# Patient Record
Sex: Female | Born: 1995 | Race: Black or African American | Hispanic: No | Marital: Single | Smoking: Never smoker
Health system: Southern US, Community
[De-identification: ages and names within clinical notes are randomized; demographics above are authoritative.]

## PROBLEM LIST (undated history)

## (undated) DIAGNOSIS — J45909 Unspecified asthma, uncomplicated: Secondary | ICD-10-CM

## (undated) DIAGNOSIS — G43909 Migraine, unspecified, not intractable, without status migrainosus: Secondary | ICD-10-CM

## (undated) HISTORY — PX: NO PAST SURGERIES: SHX2092

---

## 2020-01-07 ENCOUNTER — Emergency Department (HOSPITAL_COMMUNITY)
Admission: EM | Admit: 2020-01-07 | Discharge: 2020-01-07 | Disposition: A | Discharge status: Home / Self Care | Payer: Self-pay | Attending: Emergency Medicine | Admitting: Emergency Medicine

## 2020-01-07 ENCOUNTER — Other Ambulatory Visit: Payer: Self-pay

## 2020-01-07 ENCOUNTER — Emergency Department (HOSPITAL_COMMUNITY): Payer: Self-pay

## 2020-01-07 DIAGNOSIS — R0789 Other chest pain: Secondary | ICD-10-CM | POA: Insufficient documentation

## 2020-01-07 DIAGNOSIS — R0782 Intercostal pain: Secondary | ICD-10-CM | POA: Insufficient documentation

## 2020-01-07 DIAGNOSIS — R05 Cough: Secondary | ICD-10-CM | POA: Insufficient documentation

## 2020-01-07 DIAGNOSIS — R0602 Shortness of breath: Secondary | ICD-10-CM | POA: Insufficient documentation

## 2020-01-07 DIAGNOSIS — J4520 Mild intermittent asthma, uncomplicated: Secondary | ICD-10-CM | POA: Insufficient documentation

## 2020-01-07 LAB — CBC
HCT: 36.5 % (ref 36.0–46.0)
Hemoglobin: 12.1 g/dL (ref 12.0–15.0)
MCH: 31.9 pg (ref 26.0–34.0)
MCHC: 33.2 g/dL (ref 30.0–36.0)
MCV: 96.3 fL (ref 80.0–100.0)
Platelets: 351 10*3/uL (ref 150–400)
RBC: 3.79 MIL/uL — ABNORMAL LOW (ref 3.87–5.11)
RDW: 12.5 % (ref 11.5–15.5)
WBC: 9.4 10*3/uL (ref 4.0–10.5)
nRBC: 0 % (ref 0.0–0.2)

## 2020-01-07 LAB — BASIC METABOLIC PANEL
Anion gap: 11 (ref 5–15)
BUN: 16 mg/dL (ref 6–20)
CO2: 23 mmol/L (ref 22–32)
Calcium: 9.5 mg/dL (ref 8.9–10.3)
Chloride: 105 mmol/L (ref 98–111)
Creatinine, Ser: 1.04 mg/dL — ABNORMAL HIGH (ref 0.44–1.00)
GFR calc Af Amer: >60 mL/min (ref >60)
GFR calc non Af Amer: >60 mL/min (ref >60)
Glucose, Bld: 89 mg/dL (ref 70–99)
Potassium: 3.6 mmol/L (ref 3.5–5.1)
Sodium: 139 mmol/L (ref 135–145)

## 2020-01-07 LAB — TROPONIN I (HIGH SENSITIVITY): Troponin I (High Sensitivity): <2 ng/L (ref <18)

## 2020-01-07 MED ORDER — ALBUTEROL SULFATE HFA 108 (90 BASE) MCG/ACT IN AERS
2.0000 | INHALATION_SPRAY | RESPIRATORY_TRACT | Status: DC | PRN
  Administered 2020-01-07: 08:00:00 2 via RESPIRATORY_TRACT
  Filled 2020-01-07: qty 6.7

## 2020-01-07 MED ORDER — KETOROLAC TROMETHAMINE 15 MG/ML IJ SOLN
15.0000 mg | Freq: Once | INTRAMUSCULAR | Status: AC
  Administered 2020-01-07: 15 mg via INTRAVENOUS
  Filled 2020-01-07: qty 1

## 2020-01-07 MED ORDER — NAPROXEN 500 MG PO TABS: 500.0000 mg | ORAL_TABLET | Freq: Two times a day (BID) | ORAL | 0 refills | Status: DC

## 2020-01-07 MED ORDER — SODIUM CHLORIDE 0.9% FLUSH
3.0000 mL | Freq: Once | INTRAVENOUS | Status: AC
  Administered 2020-01-07: 3 mL via INTRAVENOUS

## 2020-01-07 MED ORDER — CYCLOBENZAPRINE HCL 10 MG PO TABS: 10.0000 mg | ORAL_TABLET | Freq: Every day | ORAL | 0 refills | Status: DC

## 2020-01-07 NOTE — ED Triage Notes (Addendum)
Pt in with L side cp/cramping that radiates down her whole L side x 4 days. Endorses cough and nausea that started today. C/o Sob when pain increases

## 2020-01-07 NOTE — ED Provider Notes (Signed)
[facility] EMERGENCY DEPARTMENT Provider Note   CSN: [id number] Arrival date & time: 01/07/20  0932     History Chief Complaint  Patient presents with  . Chest Pain  . rib pain    Megan Moran is a 24 y.o. female.  Patient is a 24 year old female with a history of asthma who is presenting today with 4 days of worsening left-sided chest pain.  She describes it as a sharp pain that starts in her mid left chest and goes all the way around her rib cage into her back.  She also occasionally feels it in her arm.  She does work for [employer] and does a lot of lifting and thought she had just pulled a muscle.  However it is worsened over the last 4 days and she got concerned.  She has had intermittent wheezing and coughing.  Patient has no unilateral leg pain or swelling, no recent immobilization or long travel and does not use OCPs.  She stopped smoking in December.  She is adopted so unknown family history.  She tested for Covid at the beginning of the year and it was negative.  However for about a month now she is felt like she has not had a normal sense of smell.  The history is provided by the patient.       No past medical history on file.  There are no problems to display for this patient.      OB History   No obstetric history on file.     No family history on file.  Social History   Tobacco Use  . Smoking status: Not on file  Substance Use Topics  . Alcohol use: Not on file  . Drug use: Not on file    Home Medications Prior to Admission medications   Not on File    Allergies    Patient has no allergy information on record.  Review of Systems   Review of Systems  All other systems reviewed and are negative.   Physical Exam Updated Vital Signs Wt 59 kg   Physical Exam Vitals and nursing note reviewed.  Constitutional:      General: She is not in acute distress.    Appearance: She is well-developed and normal weight.  HENT:   Head: Normocephalic and atraumatic.     Mouth/Throat:     Mouth: Mucous membranes are moist.  Eyes:     Conjunctiva/sclera: Conjunctivae normal.     Pupils: Pupils are equal, round, and reactive to light.  Cardiovascular:     Rate and Rhythm: Normal rate and regular rhythm.     Pulses: Normal pulses.     Heart sounds: No murmur.  Pulmonary:     Effort: Pulmonary effort is normal. No respiratory distress.     Breath sounds: Normal breath sounds. No wheezing or rales.    Chest:     Chest wall: Tenderness present.    Abdominal:     General: There is no distension.     Palpations: Abdomen is soft.     Tenderness: There is no abdominal tenderness. There is no guarding or rebound.  Musculoskeletal:        General: No tenderness. Normal range of motion.     Cervical back: Normal range of motion and neck supple.     Right lower leg: No edema.     Left lower leg: No edema.  Skin:    General: Skin is warm and dry.  Findings: No erythema or rash.  Neurological:     General: No focal deficit present.     Mental Status: She is alert and oriented to person, place, and time. Mental status is at baseline.  Psychiatric:        Mood and Affect: Mood normal.        Behavior: Behavior normal.        Thought Content: Thought content normal.     ED Results / Procedures / Treatments   Labs (all labs ordered are listed, but only abnormal results are displayed) Labs Reviewed  BASIC METABOLIC PANEL - Abnormal; Notable for the following components:      Result Value   Creatinine, Ser 1.04 (*)    All other components within normal limits  CBC - Abnormal; Notable for the following components:   RBC 3.79 (*)    All other components within normal limits  I-STAT BETA HCG BLOOD, ED ([facility], [facility], [facility] ONLY)  TROPONIN I (HIGH SENSITIVITY)  TROPONIN I (HIGH SENSITIVITY)    EKG EKG Interpretation  Date/Time:  Thursday January 07 2020 06:36:06 EST Ventricular Rate:  52 PR Interval:    QRS  Duration: 84 QT Interval:  444 QTC Calculation: 413 R Axis:   72 Text Interpretation: Sinus rhythm No old tracing to compare Confirmed by Rochele Raring [phone]) on 01/07/2020 6:39:07 AM   Radiology DG Chest 2 View  Result Date: 01/07/2020 CLINICAL DATA:  Chest pain. EXAM: CHEST - 2 VIEW COMPARISON:  None. FINDINGS: The heart size and mediastinal contours are within normal limits. Both lungs are clear. No pneumothorax or pleural effusion is noted. The visualized skeletal structures are unremarkable. IMPRESSION: No active cardiopulmonary disease. Electronically Signed   By: Lupita Raider M.D.   On: 01/07/2020 07:32    Procedures Procedures (including critical care time)  Medications Ordered in ED Medications  sodium chloride flush (NS) 0.9 % injection 3 mL (has no administration in time range)  ketorolac (TORADOL) 15 MG/ML injection 15 mg (has no administration in time range)  albuterol (VENTOLIN HFA) 108 (90 Base) MCG/ACT inhaler 2 puff (has no administration in time range)    ED Course  I have reviewed the triage vital signs and the nursing notes.  Pertinent labs & imaging results that were available during my care of the patient were reviewed by me and considered in my medical decision making (see chart for details).    MDM Rules/Calculators/A&P                      Patient is a 24 year old female with a history of asthma presenting with atypical chest pain.  Suspect musculoskeletal pain as she works in a very physical job doing a lot of lifting.  She is PERC negative and low suspicion for ACS.  Patient's EKG is within normal limits, chest x-ray within normal limits and labs without significant findings.  Patient is not wheezing on exam however she has had intermittent chest tightness and wheezing and has not been able to get her albuterol from her doctor.  She recently moved from [place] and has not established care here in [place] yet.  She did a test for Covid earlier this  year about a week ago but it came back negative.  She has not had any fever but states for about a month she feels like she is not had her smell.  Vital signs are within normal limits, pain is produced with palpation.  Low suspicion  for PE, dissection or GI pathology.  Pt improved after albuterol and toradol.  Labs and imaging neg.  Will d/c home with supportive care and pt given return precautions. Final Clinical Impression(s) / ED Diagnoses Final diagnoses:  Chest wall pain  Mild intermittent asthma, unspecified whether complicated    Rx / DC Orders ED Discharge Orders         Ordered    cyclobenzaprine (FLEXERIL) 10 MG tablet  Daily at bedtime     01/07/20 0844    naproxen (NAPROSYN) 500 MG tablet  2 times daily     01/07/20 6222           Blanchie Dessert, MD 01/07/20 1003

## 2020-01-07 NOTE — Discharge Instructions (Addendum)
Today your x-ray and blood work were all normal.  Use the anti-inflammatory medication for the next week and try to avoid heavy lifting with the left arm.  Use the inhaler as needed if you feel tight or wheezy.  Please return if you develop fever, inability to catch her breath or worsening symptoms.

## 2020-10-19 ENCOUNTER — Encounter: Payer: Self-pay | Admitting: Emergency Medicine

## 2020-10-19 ENCOUNTER — Ambulatory Visit
Admission: EM | Admit: 2020-10-19 | Discharge: 2020-10-19 | Disposition: A | Discharge status: Home / Self Care | Payer: Self-pay | Attending: Emergency Medicine | Admitting: Emergency Medicine

## 2020-10-19 DIAGNOSIS — Z7251 High risk heterosexual behavior: Secondary | ICD-10-CM | POA: Insufficient documentation

## 2020-10-19 DIAGNOSIS — R3915 Urgency of urination: Secondary | ICD-10-CM | POA: Insufficient documentation

## 2020-10-19 LAB — POCT URINALYSIS DIP (MANUAL ENTRY)
Bilirubin, UA: NEGATIVE
Glucose, UA: NEGATIVE mg/dL
Ketones, POC UA: NEGATIVE mg/dL
Nitrite, UA: NEGATIVE
Protein Ur, POC: NEGATIVE mg/dL
Spec Grav, UA: >=1.03 — AB (ref 1.010–1.025)
Urobilinogen, UA: 0.2 E.U./dL
pH, UA: 6 (ref 5.0–8.0)

## 2020-10-19 LAB — POCT URINE PREGNANCY: Preg Test, Ur: NEGATIVE

## 2020-10-19 MED ORDER — CEPHALEXIN 500 MG PO CAPS: 500.0000 mg | ORAL_CAPSULE | Freq: Two times a day (BID) | ORAL | 0 refills | Status: AC

## 2020-10-19 MED ORDER — MICONAZOLE NITRATE 2 % EX POWD: CUTANEOUS | 0 refills | Status: DC | PRN

## 2020-10-19 NOTE — Discharge Instructions (Addendum)
Today you received treatment for UTI. Testing for chlamydia, gonorrhea, trichomonas is pending: please look for these results on the MyChart app/website.  We will notify you if you are positive and outline treatment at that time.  Important to avoid all forms of sexual intercourse (oral, vaginal, anal) with any/all partners for the next 7 days to avoid spreading/reinfecting. Any/all sexual partners should be notified of testing/treatment today.  Return for persistent/worsening symptoms or if you develop fever, abdominal or pelvic pain, discharge, genital pain, blood in your urine, or are re-exposed to an STI. 

## 2020-10-19 NOTE — ED Provider Notes (Signed)
[facility]    CSN: [id number] Arrival date & time: 10/19/20  1909      History   Chief Complaint Chief Complaint  Patient presents with  . Urinary Tract Infection    HPI Megan Moran is a 24 y.o. female  Presenting for possible UTI.  Endorsing weeklong course of dysuria, urgency.  No back pain, abdominal pain, vaginal discharge.  Currently sexual active with both men and women: 1 partner.  Had unprotected intercourse 1 week ago.  First time having penetrative vaginal intercourse in > 5 yrs. requesting STI testing.  Denies history of HIV, syphilis.  History reviewed. No pertinent past medical history.  There are no problems to display for this patient.   History reviewed. No pertinent surgical history.  OB History   No obstetric history on file.      Home Medications    Prior to Admission medications   Medication Sig Start Date End Date Taking? Authorizing Provider  cephALEXin (KEFLEX) 500 MG capsule Take 1 capsule (500 mg total) by mouth 2 (two) times daily for 3 days. 10/19/20 10/22/20  Hall-Potvin, [place], PA-C  cyclobenzaprine (FLEXERIL) 10 MG tablet Take 1 tablet (10 mg total) by mouth at bedtime. 01/07/20   Gwyneth Sprout, MD  miconazole (MICOTIN) 2 % powder Apply topically as needed for itching. 10/19/20   Hall-Potvin, [place], PA-C  naproxen (NAPROSYN) 500 MG tablet Take 1 tablet (500 mg total) by mouth 2 (two) times daily. 01/07/20   Gwyneth Sprout, MD    Family History History reviewed. No pertinent family history.  Social History Social History   Tobacco Use  . Smoking status: Not on file  Substance Use Topics  . Alcohol use: Not on file  . Drug use: Not on file     Allergies   Patient has no allergy information on record.   Review of Systems As per HPI   Physical Exam Triage Vital Signs ED Triage Vitals [10/19/20 1919]  Enc Vitals Group     BP 105/72     Pulse Rate 96     Resp 20     Temp 98.6 F (37 C)      Temp Source Oral     SpO2 97 %     Weight      Height      Head Circumference      Peak Flow      Pain Score      Pain Loc      Pain Edu?      Excl. in GC?    No data found.  Updated Vital Signs BP 105/72 (BP Location: Left Arm)   Pulse 96   Temp 98.6 F (37 C) (Oral)   Resp 20   SpO2 97%   Visual Acuity Right Eye Distance:   Left Eye Distance:   Bilateral Distance:    Right Eye Near:   Left Eye Near:    Bilateral Near:     Physical Exam Constitutional:      General: She is not in acute distress. HENT:     Head: Normocephalic and atraumatic.  Eyes:     General: No scleral icterus.    Pupils: Pupils are equal, round, and reactive to light.  Cardiovascular:     Rate and Rhythm: Normal rate.  Pulmonary:     Effort: Pulmonary effort is normal.  Abdominal:     General: Bowel sounds are normal.     Palpations: Abdomen is soft.  Tenderness: There is no abdominal tenderness. There is no right CVA tenderness, left CVA tenderness or guarding.  Genitourinary:    Comments: Patient declined, self-swab performed Skin:    Coloration: Skin is not jaundiced or pale.  Neurological:     Mental Status: She is alert and oriented to person, place, and time.      UC Treatments / Results  Labs (all labs ordered are listed, but only abnormal results are displayed) Labs Reviewed  POCT URINALYSIS DIP (MANUAL ENTRY) - Abnormal; Notable for the following components:      Result Value   Spec Grav, UA >=1.030 (*)    Blood, UA large (*)    Leukocytes, UA Small (1+) (*)    All other components within normal limits  URINE CULTURE  RPR  HIV ANTIBODY (ROUTINE TESTING W REFLEX)  POCT URINE PREGNANCY  CERVICOVAGINAL ANCILLARY ONLY    EKG   Radiology No results found.  Procedures Procedures (including critical care time)  Medications Ordered in UC Medications - No data to display  Initial Impression / Assessment and Plan / UC Course  I have reviewed the triage vital  signs and the nursing notes.  Pertinent labs & imaging results that were available during my care of the patient were reviewed by me and considered in my medical decision making (see chart for details).     Urine as above, culture pending.  Will start Keflex given symptoms.  Cytology pending: We will treat for STI if indicated.  Return precautions discussed, pt verbalized understanding and is agreeable to plan. Final Clinical Impressions(s) / UC Diagnoses   Final diagnoses:  Urgency of urination  Unprotected sex     Discharge Instructions     Today you received treatment for UTI. Testing for chlamydia, gonorrhea, trichomonas is pending: please look for these results on the MyChart app/website.  We will notify you if you are positive and outline treatment at that time.  Important to avoid all forms of sexual intercourse (oral, vaginal, anal) with any/all partners for the next 7 days to avoid spreading/reinfecting. Any/all sexual partners should be notified of testing/treatment today.  Return for persistent/worsening symptoms or if you develop fever, abdominal or pelvic pain, discharge, genital pain, blood in your urine, or are re-exposed to an STI.    ED Prescriptions    Medication Sig Dispense Auth. Provider   cephALEXin (KEFLEX) 500 MG capsule Take 1 capsule (500 mg total) by mouth 2 (two) times daily for 3 days. 6 capsule Hall-Potvin, [place], PA-C   miconazole (MICOTIN) 2 % powder Apply topically as needed for itching. 70 g Hall-Potvin, [place], PA-C     PDMP not reviewed this encounter.   Hall-Potvin, [place], [place] 10/20/20 [phone]

## 2020-10-21 LAB — CERVICOVAGINAL ANCILLARY ONLY
Bacterial Vaginitis (gardnerella): POSITIVE — AB
Candida Glabrata: NEGATIVE
Candida Vaginitis: NEGATIVE
Chlamydia: NEGATIVE
Comment: NEGATIVE
Comment: NEGATIVE
Comment: NEGATIVE
Comment: NEGATIVE
Comment: NEGATIVE
Comment: NORMAL
Neisseria Gonorrhea: POSITIVE — AB
Trichomonas: NEGATIVE

## 2020-10-21 LAB — URINE CULTURE: Culture: NO GROWTH

## 2020-10-22 LAB — HIV ANTIBODY (ROUTINE TESTING W REFLEX): HIV Screen 4th Generation wRfx: NONREACTIVE

## 2020-10-22 LAB — RPR: RPR Ser Ql: NONREACTIVE

## 2020-11-22 ENCOUNTER — Telehealth: Payer: Self-pay

## 2020-11-22 ENCOUNTER — Ambulatory Visit
Admission: EM | Admit: 2020-11-22 | Discharge: 2020-11-22 | Disposition: A | Discharge status: Home / Self Care | Payer: Self-pay | Attending: Emergency Medicine | Admitting: Emergency Medicine

## 2020-11-22 DIAGNOSIS — Z7251 High risk heterosexual behavior: Secondary | ICD-10-CM

## 2020-11-22 DIAGNOSIS — Z8619 Personal history of other infectious and parasitic diseases: Secondary | ICD-10-CM

## 2020-11-22 MED ORDER — DOXYCYCLINE HYCLATE 100 MG PO CAPS: 100.0000 mg | ORAL_CAPSULE | Freq: Two times a day (BID) | ORAL | 0 refills | Status: AC

## 2020-11-22 MED ORDER — DOXYCYCLINE HYCLATE 100 MG PO CAPS: 100.0000 mg | ORAL_CAPSULE | Freq: Two times a day (BID) | ORAL | 0 refills | Status: DC

## 2020-11-22 MED ORDER — CEFTRIAXONE SODIUM 500 MG IJ SOLR
500.0000 mg | Freq: Once | INTRAMUSCULAR | Status: AC
  Administered 2020-11-22: 500 mg via INTRAMUSCULAR

## 2020-11-22 NOTE — Discharge Instructions (Signed)
Today you received treatment for gonorrhea, chlamydia. °Testing for chlamydia, gonorrhea, trichomonas is pending: please look for these results on the MyChart app/website.  We will notify you if you are positive and outline treatment at that time. ° °Important to avoid all forms of sexual intercourse (oral, vaginal, anal) with any/all partners for the next 7 days to avoid spreading/reinfecting. °Any/all sexual partners should be notified of testing/treatment today. ° °Return for persistent/worsening symptoms or if you develop fever, abdominal or pelvic pain, discharge, genital pain, blood in your urine, or are re-exposed to an STI. °

## 2020-11-22 NOTE — ED Triage Notes (Signed)
Pt states received a call yesterday to get treatment for gonorrhea. Pt denies sx's.

## 2020-11-22 NOTE — ED Provider Notes (Signed)
[facility]    CSN: [id number] Arrival date & time: 11/22/20  1709      History   Chief Complaint Chief Complaint  Patient presents with  . SEXUALLY TRANSMITTED DISEASE    HPI Megan Moran is a 24 y.o. female  Presenting for treatment for gonorrhea.  States that she was seen end of October.  Per chart review, 10/19/2020.  Tested positive for gonorrhea and BV.  Denies signs or symptoms at this time.  States that she was just notified yesterday.  History reviewed. No pertinent past medical history.  There are no problems to display for this patient.   History reviewed. No pertinent surgical history.  OB History   No obstetric history on file.      Home Medications    Prior to Admission medications   Medication Sig Start Date End Date Taking? Authorizing Provider  doxycycline (VIBRAMYCIN) 100 MG capsule Take 1 capsule (100 mg total) by mouth 2 (two) times daily for 7 days. 11/22/20 11/29/20  Hall-Potvin, [place], PA-C    Family History History reviewed. No pertinent family history.  Social History Social History   Tobacco Use  . Smoking status: Never Smoker  . Smokeless tobacco: Never Used  Substance Use Topics  . Alcohol use: Not Currently  . Drug use: Yes    Types: Marijuana     Allergies   Patient has no known allergies.   Review of Systems Review of Systems  Constitutional: Negative for fatigue and fever.  Respiratory: Negative for cough and shortness of breath.   Cardiovascular: Negative for chest pain and palpitations.  Gastrointestinal: Negative for constipation and diarrhea.  Genitourinary: Negative for dysuria, flank pain, frequency, hematuria, pelvic pain, urgency, vaginal bleeding, vaginal discharge and vaginal pain.     Physical Exam Triage Vital Signs ED Triage Vitals  Enc Vitals Group     BP 11/22/20 1736 (!) 120/42     Pulse Rate 11/22/20 1736 64     Resp 11/22/20 1736 20     Temp 11/22/20 1736 97.9 F (36.6  C)     Temp Source 11/22/20 1736 Oral     SpO2 11/22/20 1736 96 %     Weight --      Height --      Head Circumference --      Peak Flow --      Pain Score 11/22/20 1748 0     Pain Loc --      Pain Edu? --      Excl. in GC? --    No data found.  Updated Vital Signs BP (!) 120/42 (BP Location: Left Arm)   Pulse 64   Temp 97.9 F (36.6 C) (Oral)   Resp 20   LMP 11/17/2020   SpO2 96%   Visual Acuity Right Eye Distance:   Left Eye Distance:   Bilateral Distance:    Right Eye Near:   Left Eye Near:    Bilateral Near:     Physical Exam Constitutional:      General: She is not in acute distress. HENT:     Head: Normocephalic and atraumatic.  Eyes:     General: No scleral icterus.    Pupils: Pupils are equal, round, and reactive to light.  Cardiovascular:     Rate and Rhythm: Normal rate.  Pulmonary:     Effort: Pulmonary effort is normal.  Abdominal:     General: Bowel sounds are normal.     Palpations: Abdomen is soft.  Tenderness: There is no abdominal tenderness. There is no right CVA tenderness, left CVA tenderness or guarding.  Genitourinary:    Comments: Patient declined, self-swab performed Skin:    Coloration: Skin is not jaundiced or pale.  Neurological:     Mental Status: She is alert and oriented to person, place, and time.      UC Treatments / Results  Labs (all labs ordered are listed, but only abnormal results are displayed) Labs Reviewed  CERVICOVAGINAL ANCILLARY ONLY    EKG   Radiology No results found.  Procedures Procedures (including critical care time)  Medications Ordered in UC Medications  cefTRIAXone (ROCEPHIN) injection 500 mg (500 mg Intramuscular Given 11/22/20 1819)    Initial Impression / Assessment and Plan / UC Course  I have reviewed the triage vital signs and the nursing notes.  Pertinent labs & imaging results that were available during my care of the patient were reviewed by me and considered in my  medical decision making (see chart for details).     Cytology pending.  Will cover empirically for G/C given unprotected intercourse with positive gonorrhea 1 month ago.  Return precautions discussed, pt verbalized understanding and is agreeable to plan. Final Clinical Impressions(s) / UC Diagnoses   Final diagnoses:  History of gonorrhea  Unprotected sex     Discharge Instructions     Today you received treatment for gonorrhea, chlamydia. Testing for chlamydia, gonorrhea, trichomonas is pending: please look for these results on the MyChart app/website.  We will notify you if you are positive and outline treatment at that time.  Important to avoid all forms of sexual intercourse (oral, vaginal, anal) with any/all partners for the next 7 days to avoid spreading/reinfecting. Any/all sexual partners should be notified of testing/treatment today.  Return for persistent/worsening symptoms or if you develop fever, abdominal or pelvic pain, discharge, genital pain, blood in your urine, or are re-exposed to an STI.    ED Prescriptions    Medication Sig Dispense Auth. Provider   doxycycline (VIBRAMYCIN) 100 MG capsule Take 1 capsule (100 mg total) by mouth 2 (two) times daily for 7 days. 14 capsule Hall-Potvin, [place], PA-C     PDMP not reviewed this encounter.   Odette Fraction [place], [place] 11/22/20 1857

## 2020-11-24 LAB — CERVICOVAGINAL ANCILLARY ONLY
Bacterial Vaginitis (gardnerella): POSITIVE — AB
Chlamydia: NEGATIVE
Comment: NEGATIVE
Comment: NEGATIVE
Comment: NEGATIVE
Comment: NORMAL
Neisseria Gonorrhea: POSITIVE — AB
Trichomonas: NEGATIVE

## 2020-11-24 MED ORDER — METRONIDAZOLE 500 MG PO TABS: 500.0000 mg | ORAL_TABLET | Freq: Two times a day (BID) | ORAL | 0 refills | Status: DC

## 2021-01-16 ENCOUNTER — Encounter: Payer: Self-pay | Admitting: Internal Medicine

## 2021-01-16 ENCOUNTER — Ambulatory Visit: Payer: BC Managed Care – PPO | Attending: Internal Medicine | Admitting: Internal Medicine

## 2021-01-16 ENCOUNTER — Other Ambulatory Visit: Payer: Self-pay

## 2021-01-16 VITALS — BP 111/64 | HR 58 | Temp 98.3°F | Resp 16 | Ht 65.0 in | Wt 143.8 lb

## 2021-01-16 DIAGNOSIS — Z6379 Other stressful life events affecting family and household: Secondary | ICD-10-CM | POA: Insufficient documentation

## 2021-01-16 DIAGNOSIS — Z7689 Persons encountering health services in other specified circumstances: Secondary | ICD-10-CM | POA: Diagnosis not present

## 2021-01-16 DIAGNOSIS — G43009 Migraine without aura, not intractable, without status migrainosus: Secondary | ICD-10-CM | POA: Insufficient documentation

## 2021-01-16 DIAGNOSIS — Z2821 Immunization not carried out because of patient refusal: Secondary | ICD-10-CM | POA: Insufficient documentation

## 2021-01-16 DIAGNOSIS — K648 Other hemorrhoids: Secondary | ICD-10-CM | POA: Insufficient documentation

## 2021-01-16 DIAGNOSIS — R079 Chest pain, unspecified: Secondary | ICD-10-CM | POA: Insufficient documentation

## 2021-01-16 MED ORDER — IBUPROFEN 800 MG PO TABS: 800.0000 mg | ORAL_TABLET | Freq: Three times a day (TID) | ORAL | 1 refills | Status: DC | PRN

## 2021-01-16 MED ORDER — TOPIRAMATE 25 MG PO TABS: 25.0000 mg | ORAL_TABLET | Freq: Every day | ORAL | 1 refills | Status: DC

## 2021-01-16 MED ORDER — HYDROCORTISONE ACETATE 25 MG RE SUPP: 25.0000 mg | Freq: Two times a day (BID) | RECTAL | 1 refills | Status: AC | PRN

## 2021-01-16 NOTE — Progress Notes (Signed)
Pt states she gets a sharp pain in left upper chest  Pt states she gets sharp pain in left side of head
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
## 2021-01-16 NOTE — Patient Instructions (Signed)

## 2021-04-11 ENCOUNTER — Ambulatory Visit: Payer: Self-pay | Admitting: *Deleted

## 2021-04-11 NOTE — Telephone Encounter (Signed)
Pt reports abdominal pain, pelvic region. Onset 2 days ago. Reports radiates throughout abdomen and chest. States "I can feel it in my anus also." H/O internal and external hemorrhoids. No bleeding. States rectal area "Very swollen." Reports constipation due to pain. Rates pain at 8/10, constant.Also reports nausea, no vomiting "I try not to do that."  Directed to ED. States will follow disposition.   Reason for Disposition . [1] SEVERE pain (e.g., excruciating) AND [2] present > 1 hour  Answer Assessment - Initial Assessment Questions 1. LOCATION: "Where does it hurt?"      Lower back and pelvic area 2. RADIATION: "Does the pain shoot anywhere else?" (e.g., chest, back)     Throughout stomach and chest 3. ONSET: "When did the pain begin?" (e.g., minutes, hours or days ago)      2-3 days 4. SUDDEN: "Gradual or sudden onset?"     Gradually 5. PATTERN "Does the pain come and go, or is it constant?"    - If constant: "Is it getting better, staying the same, or worsening?"      (Note: Constant means the pain never goes away completely; most serious pain is constant and it progresses)     - If intermittent: "How long does it last?" "Do you have pain now?"     (Note: Intermittent means the pain goes away completely between bouts)     Constant 6. SEVERITY: "How bad is the pain?"  (e.g., Scale 1-10; mild, moderate, or severe)   - MILD (1-3): doesn't interfere with normal activities, abdomen soft and not tender to touch    - MODERATE (4-7): interferes with normal activities or awakens from sleep, tender to touch    - SEVERE (8-10): excruciating pain, doubled over, unable to do any normal activities      8/10 7. RECURRENT SYMPTOM: "Have you ever had this type of stomach pain before?" If Yes, ask: "When was the last time?" and "What happened that time?"      No 8. CAUSE: "What do you think is causing the stomach pain?"     Unsure 9. RELIEVING/AGGRAVATING FACTORS: "What makes it better or worse?"  (e.g., movement, antacids, bowel movement)     Anti -nausea meds. No vomiting 10. OTHER SYMPTOMS: "Has there been any vomiting, diarrhea, constipation, or urine problems?"      Constipation, dysuria. External and internal hemorrhoids. 11. PREGNANCY: "Is there any chance you are pregnant?" "When was your last menstrual period?"       "There's a chance."  LMP 04/03/21  Protocols used: ABDOMINAL PAIN - [facility]

## 2021-04-12 NOTE — Telephone Encounter (Signed)
Will forward to provider  

## 2021-04-13 NOTE — Telephone Encounter (Signed)
Pt went to the Urgent Care yesterday

## 2021-04-29 ENCOUNTER — Other Ambulatory Visit: Payer: Self-pay | Admitting: Internal Medicine

## 2021-04-29 DIAGNOSIS — G43009 Migraine without aura, not intractable, without status migrainosus: Secondary | ICD-10-CM

## 2021-04-30 NOTE — Telephone Encounter (Signed)
Requested medication (s) are due for refill today: yes  Requested medication (s) are on the active medication list: yes  Last refill:  01/16/21  Future visit scheduled: no  Notes to clinic:  med not delegated to NT to RF/90 day refill requested   Requested Prescriptions  Pending Prescriptions Disp Refills   topiramate (TOPAMAX) 25 MG tablet [Pharmacy Med Name: TOPIRAMATE 25 MG TABLET] 90 tablet 1    Sig: TAKE 1 TABLET BY MOUTH EVERYDAY AT BEDTIME      Not Delegated - Neurology: Anticonvulsants - topiramate & zonisamide Failed - 04/29/2021  9:08 PM      Failed - This refill cannot be delegated      Failed - Cr in normal range and within 360 days    Creatinine, Ser  Date Value Ref Range Status  01/07/2020 1.04 (H) 0.44 - 1.00 mg/dL Final          Failed - CO2 in normal range and within 360 days    CO2  Date Value Ref Range Status  01/07/2020 23 22 - 32 mmol/L Final          Passed - Valid encounter within last 12 months    Recent Outpatient Visits           3 months ago Encounter to establish care   [facility] Marcine Matar, MD

## 2021-09-21 ENCOUNTER — Other Ambulatory Visit: Payer: Self-pay

## 2021-09-21 ENCOUNTER — Emergency Department (HOSPITAL_COMMUNITY): Payer: 59

## 2021-09-21 ENCOUNTER — Encounter (HOSPITAL_COMMUNITY): Payer: Self-pay | Admitting: Emergency Medicine

## 2021-09-21 ENCOUNTER — Emergency Department (HOSPITAL_COMMUNITY)
Admission: EM | Admit: 2021-09-21 | Discharge: 2021-09-22 | Disposition: A | Discharge status: Home / Self Care | Payer: 59 | Attending: Emergency Medicine | Admitting: Emergency Medicine

## 2021-09-21 DIAGNOSIS — J45909 Unspecified asthma, uncomplicated: Secondary | ICD-10-CM | POA: Diagnosis not present

## 2021-09-21 DIAGNOSIS — Z20822 Contact with and (suspected) exposure to covid-19: Secondary | ICD-10-CM | POA: Diagnosis not present

## 2021-09-21 DIAGNOSIS — Z5321 Procedure and treatment not carried out due to patient leaving prior to being seen by health care provider: Secondary | ICD-10-CM | POA: Diagnosis not present

## 2021-09-21 HISTORY — DX: Migraine, unspecified, not intractable, without status migrainosus: G43.909

## 2021-09-21 HISTORY — DX: Unspecified asthma, uncomplicated: J45.909

## 2021-09-21 LAB — RESP PANEL BY RT-PCR (FLU A&B, COVID) ARPGX2
Influenza A by PCR: NEGATIVE
Influenza B by PCR: NEGATIVE
SARS Coronavirus 2 by RT PCR: NEGATIVE

## 2021-09-21 MED ORDER — IPRATROPIUM-ALBUTEROL 0.5-2.5 (3) MG/3ML IN SOLN: 3.0000 mL | Freq: Once | RESPIRATORY_TRACT | Status: DC

## 2021-09-21 NOTE — ED Provider Notes (Signed)
Emergency Medicine Provider Triage Evaluation Note  [name] , a 25 y.o. female  was evaluated in triage.  Pt complains of chest pain and shortness of breath over the last few days.  Does have a history of asthma and states that she has not had any nebulizer treatments as [place].  She denies any fever, congestion, abdominal pain, nausea, vomiting, diarrhea.  Review of Systems  Positive: Chills, headache Negative:   Physical Exam  BP (!) 120/94 (BP Location: Left Arm)   Pulse 61   Temp 99.1 F (37.3 C) (Oral)   Resp 20   SpO2 100%  Gen:   Awake, no distress   Resp:  Normal effort, mild expiratory wheeze heard throughout MSK:   Moves extremities without difficulty  Other:    Medical Decision Making  Medically screening exam initiated at 7:16 PM.  Appropriate orders placed.  Megan Moran was informed that the remainder of the evaluation will be completed by another provider, this initial triage assessment does not replace that evaluation, and the importance of remaining in the ED until their evaluation is complete.     Teressa Lower, PA-C 09/21/21 1918    Benjiman Core, MD 09/21/21 2235

## 2021-09-21 NOTE — ED Triage Notes (Signed)
Pt reports being out of her inhaler prescription and her asthma has been acting up for a few weeks. SOB when walking.

## 2021-09-22 NOTE — ED Notes (Signed)
Pt reports she is leaving and will see her PCP provider, risks of leaving explained to the patient who verbalized understanding. Pt seen leaving the ER with independent steady gait

## 2021-09-22 NOTE — ED Notes (Signed)
Pt states she will try her primary care to get medicine. Advised patient to stay.

## 2021-11-28 ENCOUNTER — Ambulatory Visit: Payer: 59 | Attending: Nurse Practitioner | Admitting: Nurse Practitioner

## 2021-11-28 ENCOUNTER — Other Ambulatory Visit: Payer: Self-pay

## 2021-11-28 ENCOUNTER — Encounter: Payer: Self-pay | Admitting: Nurse Practitioner

## 2021-11-28 DIAGNOSIS — J4520 Mild intermittent asthma, uncomplicated: Secondary | ICD-10-CM

## 2021-11-28 NOTE — Progress Notes (Signed)
Virtual Visit via Telephone Note Due to national recommendations of social distancing due to COVID 19, telehealth visit is felt to be most appropriate for this patient at this time.  I discussed the limitations, risks, security and privacy concerns of performing an evaluation and management service by telephone and the availability of in person appointments. I also discussed with the patient that there may be a patient responsible charge related to this service. The patient expressed understanding and agreed to proceed.    I connected with [facility] on 11/28/21  at   1:30 PM EST  EDT by telephone and verified that I am speaking with the correct person using two identifiers.  Location of Patient: Private Residence   Location of Provider: [facility]    Persons participating in Telemedicine visit: Bertram Denver FNP-BC Arial    History of Present Illness: Telemedicine visit for: Asthma and Migraines  She is requesting a letter for her employer as she has been working upwards of 10-14 hours and this has been affecting her health in regard to her migraines and asthma.  States when she works  prolonged hours after her normal scheduled shift of 8p-430a her migraines increase in intensity and she has been having to use her inhaler where she works more often. She was told that she would not be allowed to take additional breaks beyond her company approved 30 minute break and two 15 minute breaks. She only requires excessive use of her inhaler when she works more than 8 hours at [employer].  She is not a smoker.     Past Medical History:  Diagnosis Date   Asthma    Migraine     Past Surgical History:  Procedure Laterality Date   NO PAST SURGERIES      Family History  Adopted: Yes  Problem Relation Age of Onset   Diabetes Mother    Hypertension Mother    Seizures Paternal Uncle     Social History   Socioeconomic History   Marital  status: Single    Spouse name: Not on file   Number of children: 0   Years of education: Not on file   Highest education level: 12th grade  Occupational History   Not on file  Tobacco Use   Smoking status: Never   Smokeless tobacco: Never  Substance and Sexual Activity   Alcohol use: Not Currently   Drug use: Yes    Types: Marijuana    Comment: 1-2 x a wk   Sexual activity: Yes    Birth control/protection: Condom  Other Topics Concern   Not on file  Social History Narrative   Not on file   Social Determinants of Health   Financial Resource Strain: Not on file  Food Insecurity: Not on file  Transportation Needs: Not on file  Physical Activity: Not on file  Stress: Not on file  Social Connections: Not on file     Observations/Objective: Awake, alert and oriented x 3   Review of Systems  Constitutional:  Negative for fever, malaise/fatigue and weight loss.  HENT: Negative.  Negative for nosebleeds.   Eyes: Negative.  Negative for blurred vision, double vision and photophobia.  Respiratory:  Positive for cough, shortness of breath and wheezing.   Cardiovascular: Negative.  Negative for chest pain, palpitations and leg swelling.  Gastrointestinal: Negative.  Negative for heartburn, nausea and vomiting.  Musculoskeletal: Negative.  Negative for myalgias.  Neurological:  Positive for headaches. Negative for dizziness,  focal weakness and seizures.  Psychiatric/Behavioral: Negative.  Negative for suicidal ideas.    Assessment and Plan: Diagnoses and all orders for this visit:  Mild intermittent asthma without complication  Work note given in Mychart  Follow Up Instructions Return if symptoms worsen or fail to improve.     I discussed the assessment and treatment plan with the patient. The patient was provided an opportunity to ask questions and all were answered. The patient agreed with the plan and demonstrated an understanding of the instructions.   The patient was  advised to call back or seek an in-person evaluation if the symptoms worsen or if the condition fails to improve as anticipated.  I provided 10 minutes of non-face-to-face time during this encounter including median intraservice time, reviewing previous notes, labs, imaging, medications and explaining diagnosis and management.  Claiborne Rigg, FNP-BC

## 2022-01-11 ENCOUNTER — Other Ambulatory Visit: Payer: Self-pay

## 2022-01-11 ENCOUNTER — Encounter: Payer: Self-pay | Admitting: Physician Assistant

## 2022-01-11 ENCOUNTER — Ambulatory Visit: Payer: BC Managed Care – PPO | Attending: Physician Assistant | Admitting: Physician Assistant

## 2022-01-11 VITALS — BP 103/69 | HR 68 | Resp 16 | Wt 141.6 lb

## 2022-01-11 DIAGNOSIS — Z09 Encounter for follow-up examination after completed treatment for conditions other than malignant neoplasm: Secondary | ICD-10-CM | POA: Diagnosis not present

## 2022-01-11 DIAGNOSIS — L02419 Cutaneous abscess of limb, unspecified: Secondary | ICD-10-CM | POA: Diagnosis not present

## 2022-01-11 DIAGNOSIS — M5441 Lumbago with sciatica, right side: Secondary | ICD-10-CM | POA: Diagnosis not present

## 2022-01-11 MED ORDER — MUPIROCIN 2 % EX OINT: 1.0000 "application " | TOPICAL_OINTMENT | Freq: Two times a day (BID) | CUTANEOUS | 0 refills | Status: AC

## 2022-01-11 MED ORDER — METHOCARBAMOL 500 MG PO TABS: 500.0000 mg | ORAL_TABLET | Freq: Three times a day (TID) | ORAL | 0 refills | Status: DC | PRN

## 2022-01-11 MED ORDER — DICLOFENAC SODIUM 75 MG PO TBEC: 75.0000 mg | DELAYED_RELEASE_TABLET | Freq: Two times a day (BID) | ORAL | 0 refills | Status: DC

## 2022-01-11 NOTE — Progress Notes (Signed)
Patient ID: Megan Moran, female   DOB: August 01, 1996, 26 y.o.   MRN: [id number]   Megan Moran, is a 26 y.o. female  [id number]  [id number]  DOB - 06-20-1996  Chief Complaint  Patient presents with   Back Pain    st   Abdominal Pain       Subjective:   Megan Moran is a 26 y.o. female here today for a follow up visit after being seen at [facility] 2 times for abscess in the L axilla.  She just finished the doxy and never had to take the cephalexin.  Still draining a little but much improved.  Also having some lower back pain that radiates into R leg.  NKI.  No urinary s/sx.  Not SA in about 10 months.  Also needing additional paperwork filled out for a note written in December regarding work hours    Patient has No headache, No chest pain, some abdominal cramping.  No N/V- No Nausea, No new weakness tingling or numbness, No Cough - SOB.  No problems updated.  ALLERGIES: No Known Allergies  PAST MEDICAL HISTORY: Past Medical History:  Diagnosis Date   Asthma    Migraine     MEDICATIONS AT HOME: Prior to Admission medications   Medication Sig Start Date End Date Taking? Authorizing Provider  diclofenac (VOLTAREN) 75 MG EC tablet Take 1 tablet (75 mg total) by mouth 2 (two) times daily. 01/11/22  Yes [place] Spainhower, Dionne Bucy, PA-C  methocarbamol (ROBAXIN) 500 MG tablet Take 1 tablet (500 mg total) by mouth every 8 (eight) hours as needed for muscle spasms. 01/11/22  Yes Argentina Donovan, PA-C  mupirocin ointment (BACTROBAN) 2 % Apply 1 application topically 2 (two) times daily. 01/11/22  Yes Tyyne Cliett M, PA-C  hydrocortisone (ANUSOL-HC) 25 MG suppository Place 1 suppository (25 mg total) rectally 2 (two) times daily as needed for hemorrhoids or anal itching. 01/16/21   Ladell Pier, MD  ibuprofen (ADVIL) 800 MG tablet Take 1 tablet (800 mg total) by mouth every 8 (eight) hours as needed. Take with food 01/16/21   Ladell Pier, MD  metroNIDAZOLE (FLAGYL) 500 MG  tablet Take 1 tablet (500 mg total) by mouth 2 (two) times daily. Patient not taking: Reported on 01/16/2021 11/24/20   Chase Picket, MD  topiramate (TOPAMAX) 25 MG tablet TAKE 1 TABLET BY MOUTH EVERYDAY AT BEDTIME 05/01/21   Ladell Pier, MD    ROS: Neg HEENT Neg resp Neg cardiac Neg psych Neg neuro  Objective:   Vitals:   01/11/22 1350  BP: 103/69  Pulse: 68  Resp: 16  SpO2: 100%  Weight: 141 lb 9.6 oz (64.2 kg)   Exam General appearance : Awake, alert, not in any distress. Speech Clear. Not toxic looking HEENT: Atraumatic and Normocephalic, pupils equally reactive to light and accomodation Neck: Supple, no JVD. No cervical lymphadenopathy.  Chest: Good air entry bilaterally, CTAB.  No rales/rhonchi/wheezing   CVS: S1 S2 regular, no murmurs.  L axilla-site for I&D healing with minimal drainage.  No erythema or induration Back-full S&ROM.  Some spasms in B lower back.  Neg SLR B.  B DTR=intact B. Extremities: B/L Lower Ext shows no edema, both legs are warm to touch Neurology: Awake alert, and oriented X 3, CN II-XII intact, Non focal Skin: No Rash  Data Review No results found for: HGBA1C  Assessment & Plan   1. Acute right-sided low back pain with right-sided sciatica No red flags - diclofenac (VOLTAREN)  75 MG EC tablet; Take 1 tablet (75 mg total) by mouth 2 (two) times daily.  Dispense: 30 tablet; Refill: 0 - methocarbamol (ROBAXIN) 500 MG tablet; Take 1 tablet (500 mg total) by mouth every 8 (eight) hours as needed for muscle spasms.  Dispense: 90 tablet; Refill: 0 - Comprehensive metabolic panel  2. Abscess, axilla resolving - mupirocin ointment (BACTROBAN) 2 %; Apply 1 application topically 2 (two) times daily.  Dispense: 22 g; Refill: 0 - CBC with Differential/Platelet  3. Encounter for examination following treatment at hospital Improving. I am unfamiliar with reasons discussed for accomodation at work.  We will have the patient leave the forms for  Zelda to complete(Zelda wrote note on 11/28/2021)    Patient have been counseled extensively about nutrition and exercise. Other issues discussed during this visit include: low cholesterol diet, weight control and daily exercise, foot care, annual eye examinations at Ophthalmology, importance of adherence with medications and regular follow-up. We also discussed long term complications of uncontrolled diabetes and hypertension.   Return in about 3 months (around 04/11/2022) for see PCP for ongoing issues.  The patient was given clear instructions to go to ER or return to medical center if symptoms don't improve, worsen or new problems develop. The patient verbalized understanding. The patient was told to call to get lab results if they haven't heard anything in the next week.      Freeman Caldron, PA-C [facility] [place], [place]   01/11/2022, 2:06 PM

## 2022-01-12 LAB — COMPREHENSIVE METABOLIC PANEL
ALT: 27 IU/L (ref 0–32)
AST: 29 IU/L (ref 0–40)
Albumin/Globulin Ratio: 1.5 (ref 1.2–2.2)
Albumin: 4.3 g/dL (ref 3.9–5.0)
Alkaline Phosphatase: 49 IU/L (ref 44–121)
BUN/Creatinine Ratio: 10 (ref 9–23)
BUN: 10 mg/dL (ref 6–20)
Bilirubin Total: 0.3 mg/dL (ref 0.0–1.2)
CO2: 23 mmol/L (ref 20–29)
Calcium: 9.3 mg/dL (ref 8.7–10.2)
Chloride: 107 mmol/L — ABNORMAL HIGH (ref 96–106)
Creatinine, Ser: 1 mg/dL (ref 0.57–1.00)
Globulin, Total: 2.9 g/dL (ref 1.5–4.5)
Glucose: 86 mg/dL (ref 70–99)
Potassium: 4.2 mmol/L (ref 3.5–5.2)
Sodium: 140 mmol/L (ref 134–144)
Total Protein: 7.2 g/dL (ref 6.0–8.5)
eGFR: 80 mL/min/{1.73_m2} (ref >59)

## 2022-01-12 LAB — CBC WITH DIFFERENTIAL/PLATELET
Basophils Absolute: 0.1 10*3/uL (ref 0.0–0.2)
Basos: 1 %
EOS (ABSOLUTE): 0.4 10*3/uL (ref 0.0–0.4)
Eos: 6 %
Hematocrit: 37.2 % (ref 34.0–46.6)
Hemoglobin: 12.6 g/dL (ref 11.1–15.9)
Immature Grans (Abs): 0 10*3/uL (ref 0.0–0.1)
Immature Granulocytes: 0 %
Lymphocytes Absolute: 3.2 10*3/uL — ABNORMAL HIGH (ref 0.7–3.1)
Lymphs: 41 %
MCH: 32.3 pg (ref 26.6–33.0)
MCHC: 33.9 g/dL (ref 31.5–35.7)
MCV: 95 fL (ref 79–97)
Monocytes Absolute: 0.6 10*3/uL (ref 0.1–0.9)
Monocytes: 8 %
Neutrophils Absolute: 3.4 10*3/uL (ref 1.4–7.0)
Neutrophils: 44 %
Platelets: 356 10*3/uL (ref 150–450)
RBC: 3.9 x10E6/uL (ref 3.77–5.28)
RDW: 12.4 % (ref 11.7–15.4)
WBC: 7.8 10*3/uL (ref 3.4–10.8)

## 2022-01-15 ENCOUNTER — Encounter: Payer: Self-pay | Admitting: *Deleted

## 2023-03-28 ENCOUNTER — Emergency Department (HOSPITAL_COMMUNITY)
Admission: EM | Admit: 2023-03-28 | Discharge: 2023-03-29 | Disposition: A | Discharge status: Home / Self Care | Payer: Medicaid Other | Attending: Emergency Medicine | Admitting: Emergency Medicine

## 2023-03-28 ENCOUNTER — Other Ambulatory Visit: Payer: Self-pay

## 2023-03-28 DIAGNOSIS — S39012A Strain of muscle, fascia and tendon of lower back, initial encounter: Secondary | ICD-10-CM | POA: Insufficient documentation

## 2023-03-28 DIAGNOSIS — M545 Low back pain, unspecified: Secondary | ICD-10-CM | POA: Diagnosis present

## 2023-03-28 DIAGNOSIS — X58XXXA Exposure to other specified factors, initial encounter: Secondary | ICD-10-CM | POA: Diagnosis not present

## 2023-03-28 NOTE — ED Triage Notes (Signed)
Pt is ambulatory with steady gait to triage, reporting she noticed lower back pain when trying to get out of the bed around 3pm, says it was difficult to get out of bed d/t the pain. Denies specific injury. Does reports some pressure and difficult to urinate. Denies fevers. No meds for the pain.

## 2023-03-29 LAB — URINALYSIS, ROUTINE W REFLEX MICROSCOPIC
Bilirubin Urine: NEGATIVE
Glucose, UA: NEGATIVE mg/dL
Hgb urine dipstick: NEGATIVE
Ketones, ur: NEGATIVE mg/dL
Leukocytes,Ua: NEGATIVE
Nitrite: NEGATIVE
Protein, ur: NEGATIVE mg/dL
Specific Gravity, Urine: 1.021 (ref 1.005–1.030)
pH: 6 (ref 5.0–8.0)

## 2023-03-29 LAB — PREGNANCY, URINE: Preg Test, Ur: NEGATIVE

## 2023-03-29 MED ORDER — KETOROLAC TROMETHAMINE 30 MG/ML IJ SOLN
30.0000 mg | Freq: Once | INTRAMUSCULAR | Status: AC
  Administered 2023-03-29: 30 mg via INTRAMUSCULAR
  Filled 2023-03-29: qty 1

## 2023-03-29 MED ORDER — NAPROXEN 500 MG PO TABS: 500.0000 mg | ORAL_TABLET | Freq: Two times a day (BID) | ORAL | 0 refills | Status: AC

## 2023-03-29 MED ORDER — METHOCARBAMOL 500 MG PO TABS: 1000.0000 mg | ORAL_TABLET | Freq: Four times a day (QID) | ORAL | 0 refills | Status: AC

## 2023-03-29 NOTE — Discharge Instructions (Signed)
Please read and follow all provided instructions.  Your diagnoses today include:  1. Back strain, initial encounter     Tests performed today include: Vital signs - see below for your results today Urine testing: does not show an infection  Medications prescribed:  Robaxin (methocarbamol) - muscle relaxer medication  DO NOT drive or perform any activities that require you to be awake and alert because this medicine can make you drowsy.   Naproxen - anti-inflammatory pain medication Do not exceed 500mg  naproxen every 12 hours, take with food  You have been prescribed an anti-inflammatory medication or NSAID. Take with food. Take smallest effective dose for the shortest duration needed for your pain. Stop taking if you experience stomach pain or vomiting.   Take any prescribed medications only as directed.  Home care instructions:  Follow any educational materials contained in this packet Please rest, use ice or heat on your back for the next several days Do not lift, push, pull anything more than 10 pounds for the next week  Follow-up instructions: Please follow-up with your primary care provider in the next 1 week for further evaluation of your symptoms.   Return instructions:  SEEK IMMEDIATE MEDICAL ATTENTION IF YOU HAVE: New numbness, tingling, weakness, or problem with the use of your arms or legs Severe back pain not relieved with medications Loss control of your bowels or bladder Increasing pain in any areas of the body (such as chest or abdominal pain) Shortness of breath, dizziness, or fainting.  Worsening nausea (feeling sick to your stomach), vomiting, fever, or sweats Any other emergent concerns regarding your health   Additional Information:  Your vital signs today were: BP (!) 124/90 (BP Location: Right Arm)   Pulse (!) 58   Temp 98.1 F (36.7 C) (Oral)   Resp 16   LMP 03/12/2023   SpO2 99%  If your blood pressure (BP) was elevated above 135/85 this  visit, please have this repeated by your doctor within one month. --------------

## 2023-03-29 NOTE — ED Provider Notes (Signed)
[place] [facility]MENT [facility] Provider [facility]   CSN: [id number] Arrival date & time: 03/28/23  2341     History  Chief Complaint  Patient presents with   Back Pain    Megan Moran is a 27 y.o. female.  Patient presents to the emergency department for evaluation of bilateral mid to lower back pain.  Symptoms started after she awoke today at 3 PM.  She works [employer].  She does a lot of bending and lifting for her job.  She denies acute injury.  She tried applying heat at home without improvement.  Denies taking any medications for this.  She does report recent constipation for which she is taking MiraLAX.  She also has noted some discomfort with urinating and dark appearance of her urine tonight.  She is not sexually active per her report.  She was having trouble getting up and walking, as certain positions and movements make the pain worse.  This prompted ED evaluation. Patient denies warning symptoms of back pain including: fecal incontinence, urinary retention or overflow incontinence, night sweats, waking from sleep with back pain, unexplained fevers or weight loss, h/o cancer, IVDU, recent trauma.          Home Medications Prior to Admission medications   Medication Sig Start Date End Date Taking? Authorizing Provider  diclofenac (VOLTAREN) 75 MG EC tablet Take 1 tablet (75 mg total) by mouth 2 (two) times daily. 01/11/22   Anders SimmondsMcClung, Angela M, PA-C  hydrocortisone (ANUSOL-HC) 25 MG suppository Place 1 suppository (25 mg total) rectally 2 (two) times daily as needed for hemorrhoids or anal itching. 01/16/21   Marcine MatarJohnson, Deborah B, MD  ibuprofen (ADVIL) 800 MG tablet Take 1 tablet (800 mg total) by mouth every 8 (eight) hours as needed. Take with food 01/16/21   Marcine MatarJohnson, Deborah B, MD  methocarbamol (ROBAXIN) 500 MG tablet Take 1 tablet (500 mg total) by mouth every 8 (eight) hours as needed for muscle spasms. 01/11/22   Anders SimmondsMcClung, Angela M, PA-C  metroNIDAZOLE  (FLAGYL) 500 MG tablet Take 1 tablet (500 mg total) by mouth 2 (two) times daily. Patient not taking: Reported on 01/16/2021 11/24/20   Merrilee JanskyLamptey, Philip O, MD  mupirocin ointment (BACTROBAN) 2 % Apply 1 application topically 2 (two) times daily. 01/11/22   Anders SimmondsMcClung, Angela M, PA-C  topiramate (TOPAMAX) 25 MG tablet TAKE 1 TABLET BY MOUTH EVERYDAY AT BEDTIME 05/01/21   Marcine MatarJohnson, Deborah B, MD      Allergies    Patient has no known allergies.    Review of Systems   Review of Systems  Physical Exam Updated Vital Signs BP (!) 124/90 (BP Location: Right Arm)   Pulse (!) 58   Temp 98.1 F (36.7 C) (Oral)   Resp 16   LMP 03/12/2023   SpO2 99%   Physical Exam Vitals and nursing note reviewed.  Constitutional:      Appearance: She is well-developed.  HENT:     Head: Normocephalic and atraumatic.  Eyes:     Conjunctiva/sclera: Conjunctivae normal.  Pulmonary:     Effort: Pulmonary effort is normal.  Abdominal:     Palpations: Abdomen is soft.     Tenderness: There is no abdominal tenderness.  Musculoskeletal:     Cervical back: Normal range of motion and neck supple. No tenderness. Normal range of motion.     Thoracic back: Tenderness present. No bony tenderness. Decreased range of motion.     Lumbar back: Tenderness present. No bony tenderness.  Decreased range of motion.       Back:     Comments: No step-off noted with palpation of spine.   Skin:    General: Skin is warm and dry.     Findings: No rash.  Neurological:     Mental Status: She is alert.     Sensory: No sensory deficit.     Deep Tendon Reflexes: Reflexes are normal and symmetric.     Comments: 5/5 strength in entire lower extremities bilaterally. No sensation deficit.      ED Results / Procedures / Treatments   Labs (all labs ordered are listed, but only abnormal results are displayed) Labs Reviewed  URINALYSIS, ROUTINE W REFLEX MICROSCOPIC - Abnormal; Notable for the following components:      Result Value    APPearance HAZY (*)    All other components within normal limits  PREGNANCY, URINE    EKG None  Radiology No results found.  Procedures Procedures    Medications Ordered in ED Medications  ketorolac (TORADOL) 30 MG/ML injection 30 mg (has no administration in time range)    ED Course/ Medical Decision Making/ A&P    Patient seen and examined. History obtained directly from patient.   Labs/EKG: UA and urine pregnancy  Imaging: Considered imaging of the lower back with x-ray, but feel that this would be low yield given patient presentation.  Medications/Fluids: Ordered: IM Toradol.   Most recent vital signs reviewed and are as follows: BP (!) 124/90 (BP Location: Right Arm)   Pulse (!) 58   Temp 98.1 F (36.7 C) (Oral)   Resp 16   LMP 03/12/2023   SpO2 99%   Initial impression: Musculoskeletal lower back pain  Reviewed pertinent lab work and imaging with patient at bedside. Questions answered.   Plan: Treat symptoms and follow-up on lab work given reported change in urine.  Likely discharged home with Robaxin and NSAIDs.  1:46 AM Pt requesting discharge.   Prescriptions written for: Robaxin, naproxen  Other home care instructions discussed: Patient was counseled on back pain precautions and told to do activity as tolerated but do not lift, push, or pull heavy objects more than 10 pounds for the next week.  Patient counseled to use ice or heat on back for no longer than 15 minutes every hour.   Patient counseled on proper use of muscle relaxant medication. They were encouraged not to drink alcohol, drive any vehicle, or do any dangerous activities while taking this medication.   ED return instructions discussed: Urged to return with worsening severe pain, loss of bowel or bladder control, trouble walking.   Follow-up instructions discussed: Patient urged to follow-up with PCP if pain does not improve with treatment and rest in the next 1 week or if pain becomes  recurrent in the future.   The patient verbalizes understanding and agrees with the plan.                            Medical Decision Making Amount and/or Complexity of Data Reviewed Labs: ordered.  Risk Prescription drug management.   Patient with back pain, reproducible, no radicular features. No neurological deficits. Patient is ambulatory. No warning symptoms of back pain including: fecal incontinence, urinary retention or overflow incontinence, night sweats, waking from sleep with back pain, unexplained fevers or weight loss, h/o cancer, IVDU, recent trauma. No concern for cauda equina, epidural abscess, or other serious cause of back pain. Conservative measures  such as rest, ice/heat and pain medicine indicated with PCP follow-up if no improvement with conservative management.         Final Clinical Impression(s) / ED Diagnoses Final diagnoses:  Back strain, initial encounter    Rx / DC Orders ED Discharge Orders          Ordered    methocarbamol (ROBAXIN) 500 MG tablet  4 times daily        03/29/23 0141    naproxen (NAPROSYN) 500 MG tablet  2 times daily        03/29/23 0141              Renne Crigler, PA-C 03/29/23 0146    Sloan Leiter, DO 03/29/23 [phone]

## 2023-04-11 ENCOUNTER — Other Ambulatory Visit: Payer: Self-pay

## 2023-04-11 ENCOUNTER — Emergency Department (HOSPITAL_COMMUNITY): Payer: Medicaid Other

## 2023-04-11 ENCOUNTER — Emergency Department (HOSPITAL_COMMUNITY)
Admission: EM | Admit: 2023-04-11 | Discharge: 2023-04-11 | Disposition: A | Discharge status: Home / Self Care | Payer: Medicaid Other | Attending: Emergency Medicine | Admitting: Emergency Medicine

## 2023-04-11 ENCOUNTER — Encounter (HOSPITAL_COMMUNITY): Payer: Self-pay

## 2023-04-11 DIAGNOSIS — R319 Hematuria, unspecified: Secondary | ICD-10-CM | POA: Insufficient documentation

## 2023-04-11 MED ORDER — KETOROLAC TROMETHAMINE 30 MG/ML IJ SOLN
30.0000 mg | Freq: Once | INTRAMUSCULAR | Status: DC
  Filled 2023-04-11: qty 1

## 2023-04-11 MED ORDER — KETOROLAC TROMETHAMINE 15 MG/ML IJ SOLN
30.0000 mg | Freq: Once | INTRAMUSCULAR | Status: AC
  Administered 2023-04-11: 30 mg via INTRAMUSCULAR

## 2023-04-11 NOTE — ED Provider Notes (Signed)
[place] EMERGENCY DEPARTMENT AT [facility] Provider Note   CSN: [id number] Arrival date & time: 04/11/23  2131     History  Chief Complaint  Patient presents with   Flank Pain   Hematuria    Megan Moran is a 27 y.o. female.  Patient was seen today at [facility] urgent care for blood in her urine patient reports that she has been having back pain on and off for 2 weeks.  Patient was seen here and treated with a muscle relaxer and an anti-inflammatory medication.  Patient reports that when she urinated today she saw blood.  Patient has her paperwork with her from urgent care that shows she had a urinalysis with blood  The history is provided by the patient. No language interpreter was used.  Flank Pain This is a recurrent problem. The problem occurs constantly. The problem has not changed since onset.Pertinent negatives include no abdominal pain. Nothing aggravates the symptoms. Nothing relieves the symptoms. She has tried nothing for the symptoms.  Hematuria Pertinent negatives include no abdominal pain.       Home Medications Prior to Admission medications   Medication Sig Start Date End Date Taking? Authorizing Provider  hydrocortisone (ANUSOL-HC) 25 MG suppository Place 1 suppository (25 mg total) rectally 2 (two) times daily as needed for hemorrhoids or anal itching. 01/16/21   Marcine Matar, MD  methocarbamol (ROBAXIN) 500 MG tablet Take 2 tablets (1,000 mg total) by mouth 4 (four) times daily. 03/29/23   Renne Crigler, PA-C  mupirocin ointment (BACTROBAN) 2 % Apply 1 application topically 2 (two) times daily. 01/11/22   Anders Simmonds, PA-C  naproxen (NAPROSYN) 500 MG tablet Take 1 tablet (500 mg total) by mouth 2 (two) times daily. 03/29/23   Renne Crigler, PA-C  topiramate (TOPAMAX) 25 MG tablet TAKE 1 TABLET BY MOUTH EVERYDAY AT BEDTIME 05/01/21   Marcine Matar, MD      Allergies    Patient has no known allergies.    Review of Systems    Review of Systems  Gastrointestinal:  Negative for abdominal pain.  Genitourinary:  Positive for flank pain and hematuria.  All other systems reviewed and are negative.   Physical Exam Updated Vital Signs BP (!) 112/59   Pulse 76   Temp 98 F (36.7 C) (Oral)   Resp 18   Ht  (1.626 m)   Wt 67.1 kg   LMP 03/12/2023   SpO2 99%   BMI 25.40 kg/m  Physical Exam Vitals and nursing note reviewed.  Constitutional:      Appearance: She is well-developed.  HENT:     Head: Normocephalic.  Cardiovascular:     Rate and Rhythm: Normal rate.  Pulmonary:     Effort: Pulmonary effort is normal.  Abdominal:     General: There is no distension.  Musculoskeletal:        General: Normal range of motion.     Cervical back: Normal range of motion.  Skin:    General: Skin is warm.  Neurological:     General: No focal deficit present.     Mental Status: She is alert and oriented to person, place, and time.  Psychiatric:        Mood and Affect: Mood normal.     ED Results / Procedures / Treatments   Labs (all labs ordered are listed, but only abnormal results are displayed) Labs Reviewed - No data to display  EKG None  Radiology CT  Renal Stone Study  Result Date: 04/11/2023 CLINICAL DATA:  Abdominal/flank pain, stone suspected flank pain, blood in ua negative pregnancy test at urgent care EXAM: CT ABDOMEN AND PELVIS WITHOUT CONTRAST TECHNIQUE: Multidetector CT imaging of the abdomen and pelvis was performed following the standard protocol without IV contrast. RADIATION DOSE REDUCTION: This exam was performed according to the departmental dose-optimization program which includes automated exposure control, adjustment of the mA and/or kV according to patient size and/or use of iterative reconstruction technique. COMPARISON:  None Available. FINDINGS: Lower chest: Lower lobe bronchial thickening. No focal airspace disease. Hepatobiliary: No focal liver abnormality is seen. No  gallstones, gallbladder wall thickening, or biliary dilatation. Pancreas: No ductal dilatation or inflammation. Spleen: Normal in size without focal abnormality. Adrenals/Urinary Tract: No adrenal nodule. No hydronephrosis. No renal calculi. No perinephric edema. There is no obvious focal renal abnormality on this unenhanced exam. Unremarkable urinary bladder. No bladder stone or wall thickening. Stomach/Bowel: No bowel obstruction or inflammation. Moderate volume of colonic stool. Normal appendix. Vascular/Lymphatic: Normal caliber abdominal aorta. There is no bulky abdominopelvic adenopathy. Reproductive: Anteverted uterus. No adnexal mass. Other: No free air. No ascites or focal fluid collection. Musculoskeletal: There are no acute or suspicious osseous abnormalities. IMPRESSION: 1. No renal stones or obstructive uropathy. 2. Moderate volume of colonic stool, can be seen with constipation. 3. Lower lobe bronchial thickening, typically bronchitis or reactive airways disease/asthma. Electronically Signed   By: Narda Rutherford M.D.   On: 04/11/2023 23:09    Procedures Procedures    Medications Ordered in ED Medications  ketorolac (TORADOL) 15 MG/ML injection 30 mg (30 mg Intramuscular Given 04/11/23 2303)    ED Course/ Medical Decision Making/ A&P                             Medical Decision Making Patient complains of low back pain and hematuria  Amount and/or Complexity of Data Reviewed External Data Reviewed: labs and notes.    Details: Urgent care notes reviewed patient had a urinalysis with blood. Radiology: ordered and independent interpretation performed. Decision-making details documented in ED Course.    Details: Ct scan ordered reviewed and interpreted.  Ct scan shows no evidence of kidney stone.   Risk Prescription drug management. Risk Details: Pt advised to follow up with Urology and primary care.             Final Clinical Impression(s) / ED Diagnoses Final  diagnoses:  Hematuria, unspecified type    Rx / DC Orders ED Discharge Orders     None     An After Visit Summary was printed and given to the patient.     Elson Areas, PA-C 04/11/23 2357    Glyn Ade, MD 04/12/23 [phone]

## 2023-04-11 NOTE — ED Triage Notes (Addendum)
Patient reports bilateral flank pain for a few weeks and hematuria starting today. Pain worse on the right side. Was seen at [facility] and referred here to r/o kidney stone. Endorses urethral pain as well.

## 2023-04-11 NOTE — ED Notes (Signed)
Pt asking for pain  [place], [place], [place] notified

## 2023-09-13 IMAGING — CR DG CHEST 2V
2 series · 2 of 2 positions shown · non-contrast
Comparison: None.

CLINICAL DATA: Chest pain and shortness of breath

EXAM:
CHEST - 2 VIEW

[chest pa]
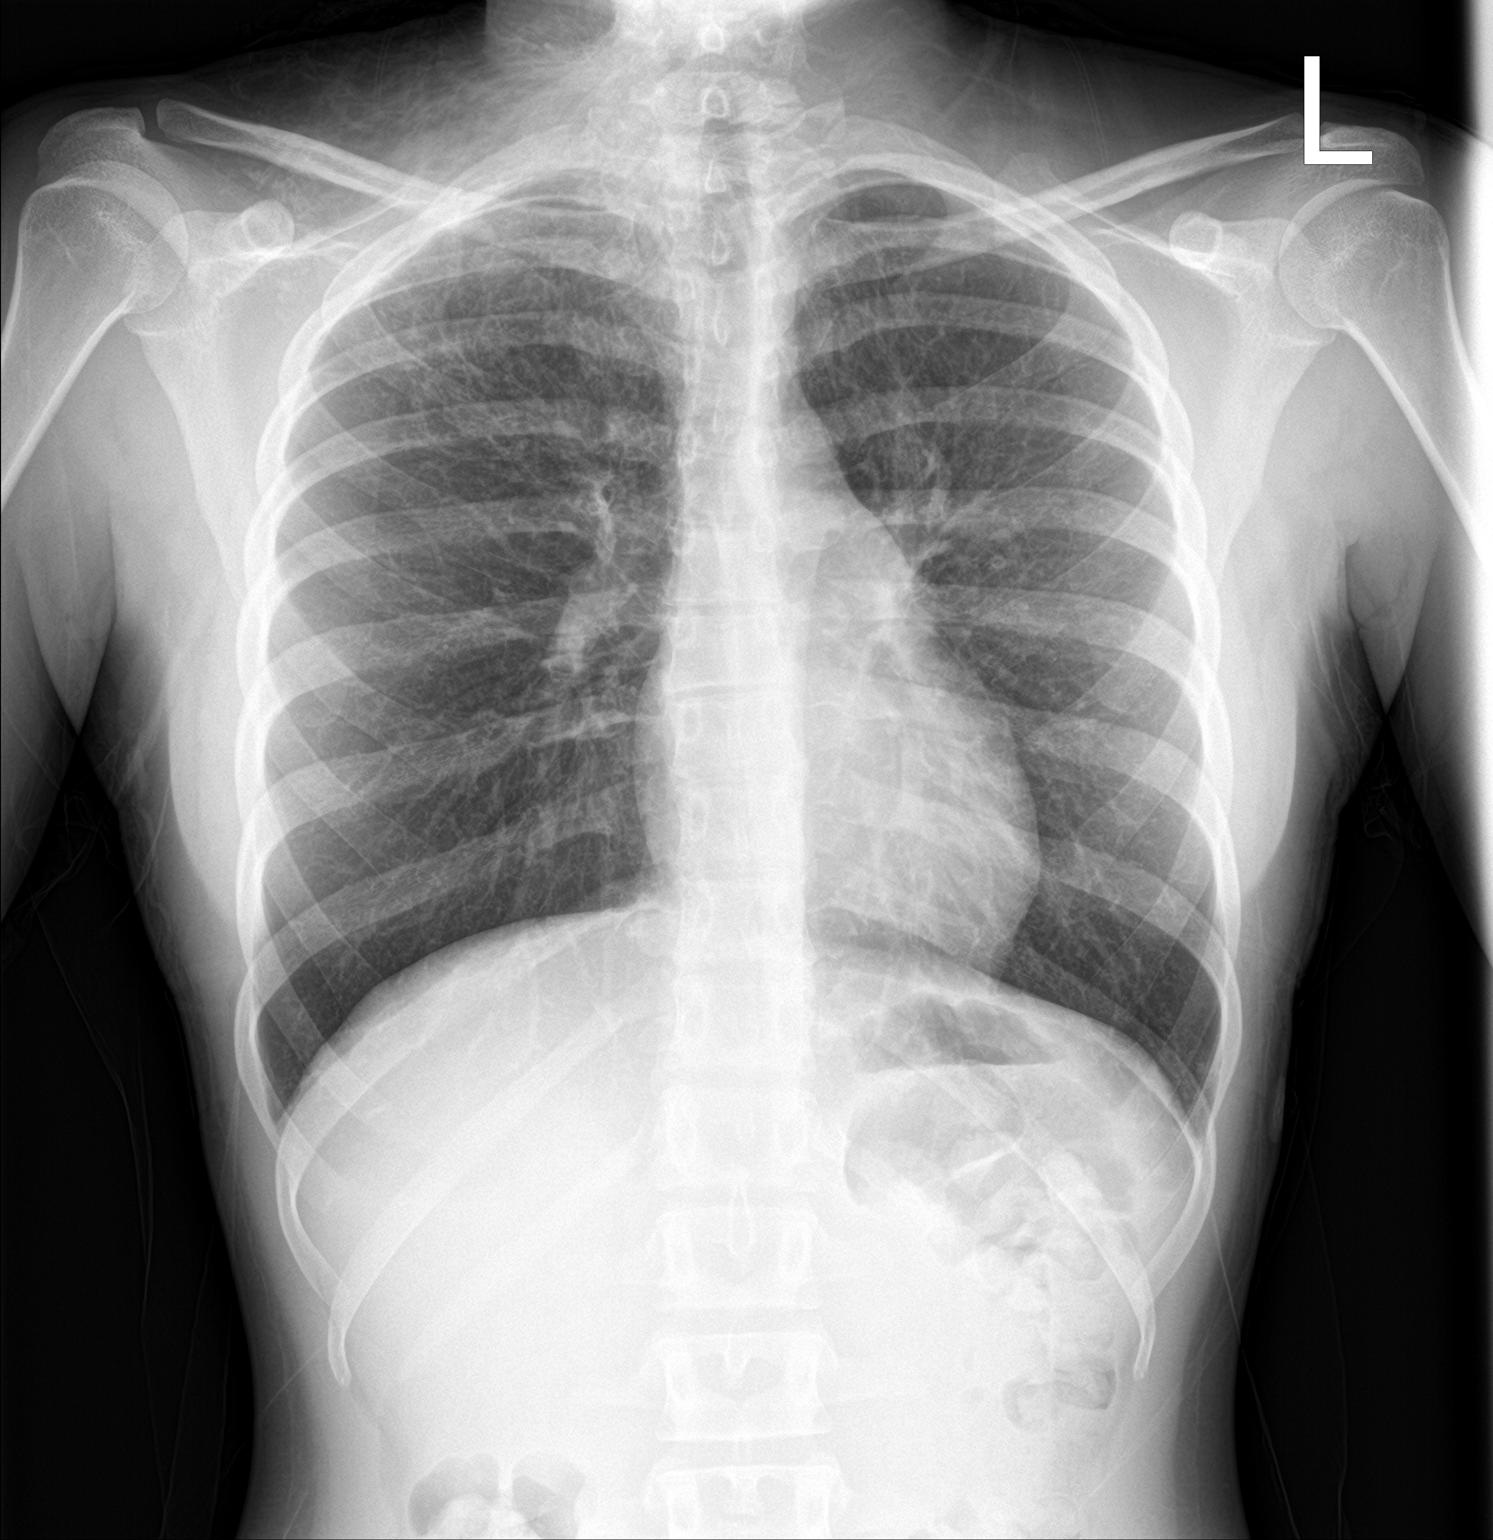

[chest lat]
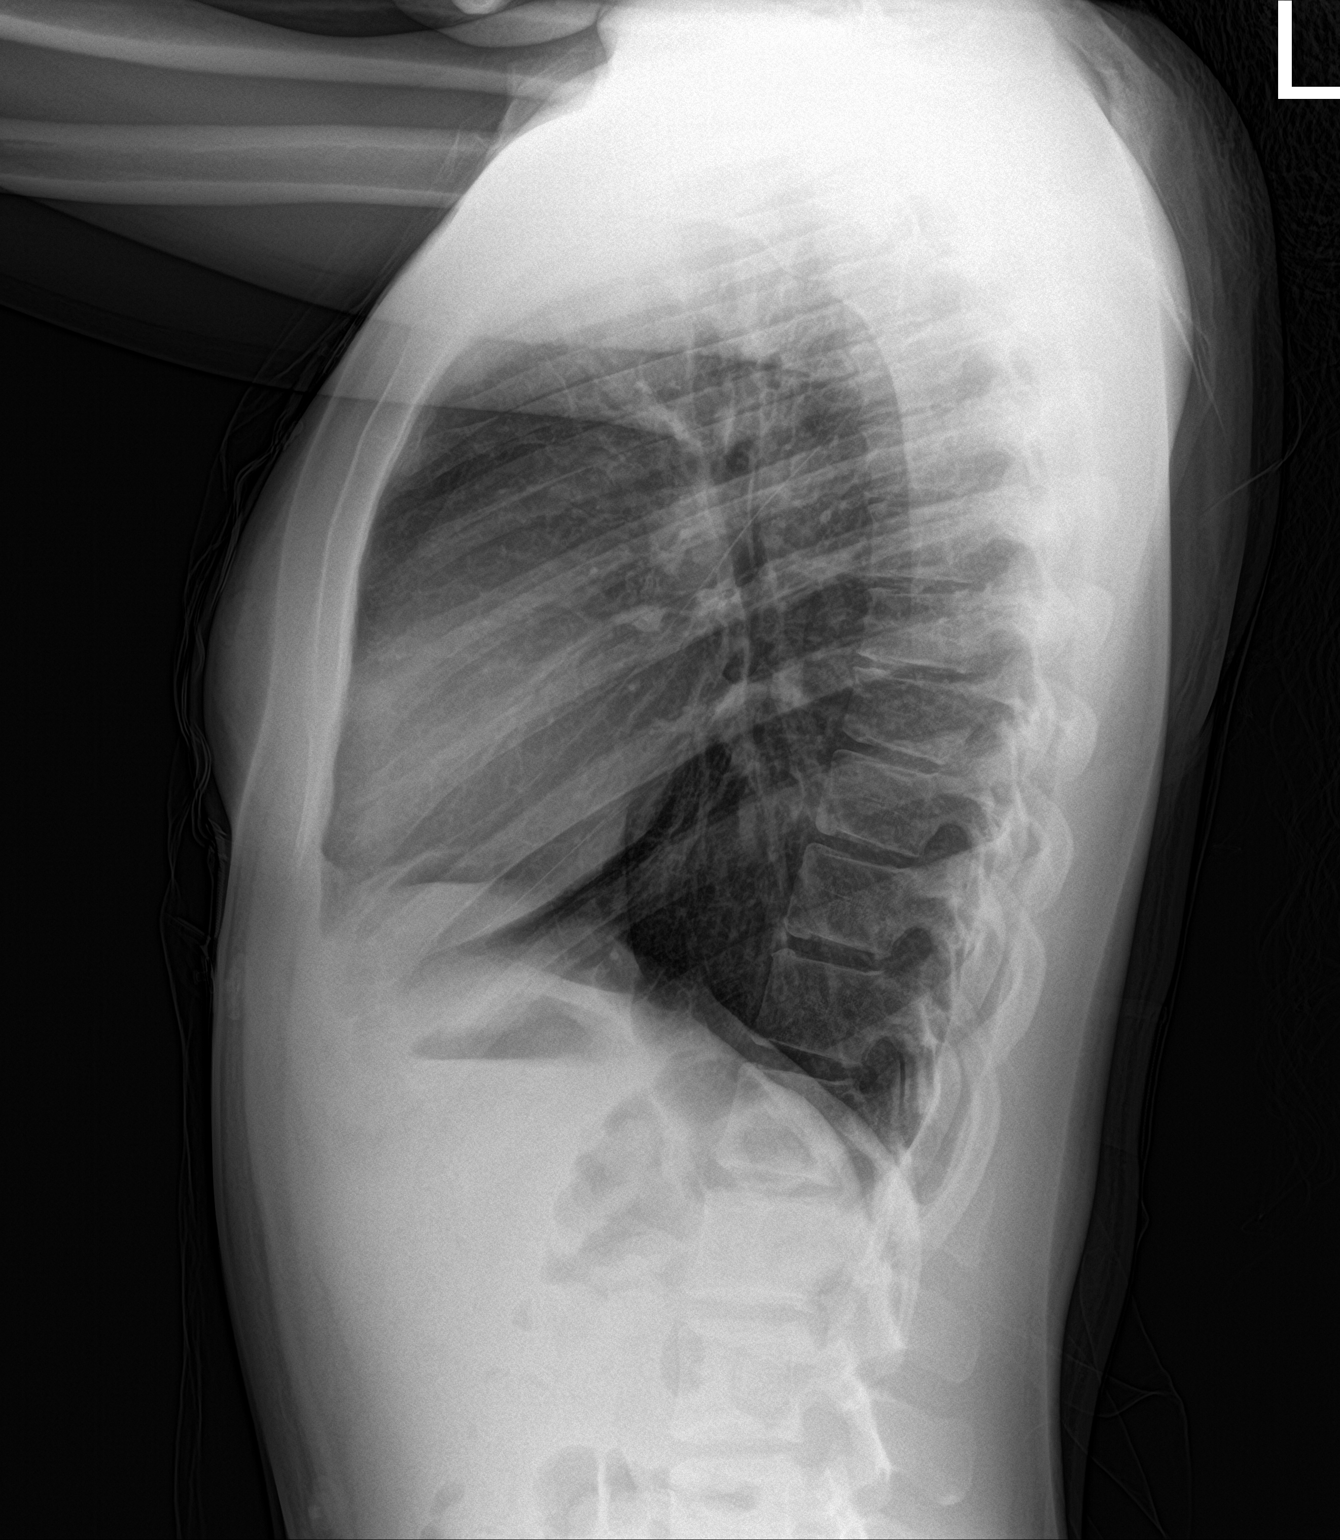

[2 of 2 positions shown; findings below may reference images not displayed]

FINDINGS: The heart size and mediastinal contours are within normal limits.
Both lungs are clear. The visualized skeletal structures are
unremarkable. There are irregular opacities overlying the right lung
apex and the lower neck due to the patient's hair.
IMPRESSION: No active cardiopulmonary disease.

## 2024-08-10 ENCOUNTER — Emergency Department (HOSPITAL_BASED_OUTPATIENT_CLINIC_OR_DEPARTMENT_OTHER)
Admission: EM | Admit: 2024-08-10 | Discharge: 2024-08-11 | Disposition: A | Discharge status: Home / Self Care | Payer: Self-pay | Attending: Emergency Medicine | Admitting: Emergency Medicine

## 2024-08-10 ENCOUNTER — Other Ambulatory Visit: Payer: Self-pay

## 2024-08-10 ENCOUNTER — Encounter (HOSPITAL_BASED_OUTPATIENT_CLINIC_OR_DEPARTMENT_OTHER): Payer: Self-pay | Admitting: Emergency Medicine

## 2024-08-10 DIAGNOSIS — L02419 Cutaneous abscess of limb, unspecified: Secondary | ICD-10-CM

## 2024-08-10 DIAGNOSIS — L02411 Cutaneous abscess of right axilla: Secondary | ICD-10-CM | POA: Insufficient documentation

## 2024-08-10 MED ORDER — CLINDAMYCIN HCL 150 MG PO CAPS
300.0000 mg | ORAL_CAPSULE | Freq: Once | ORAL | Status: AC
  Administered 2024-08-11: 300 mg via ORAL
  Filled 2024-08-10: qty 2

## 2024-08-10 MED ORDER — LIDOCAINE HCL (PF) 1 % IJ SOLN
5.0000 mL | Freq: Once | INTRAMUSCULAR | Status: AC
  Administered 2024-08-10: 5 mL via INTRADERMAL
  Filled 2024-08-10: qty 5

## 2024-08-10 MED ORDER — OXYCODONE-ACETAMINOPHEN 5-325 MG PO TABS
2.0000 | ORAL_TABLET | Freq: Once | ORAL | Status: AC
  Administered 2024-08-11: 2 via ORAL
  Filled 2024-08-10: qty 2

## 2024-08-10 NOTE — ED Provider Notes (Signed)
 [name] EMERGENCY DEPARTMENT AT [facility] Provider Note   CSN: [id number] Arrival date & time: 08/10/24  2000     Patient presents with: Abscess   Megan Moran  is a 28 y.o. female.   Patient is a 28 year old female with history of hidradenitis suppurativa presenting with an abscess to her right axilla.  This has been worsening over the past several days.  No fevers or chills.  Pain worse with palpation with no alleviating factors.       Prior to Admission medications   Medication Sig Start Date End Date Taking? Authorizing Provider  hydrocortisone  (ANUSOL -HC) 25 MG suppository Place 1 suppository (25 mg total) rectally 2 (two) times daily as needed for hemorrhoids or anal itching. 01/16/21   Vicci Barnie NOVAK, MD  methocarbamol  (ROBAXIN ) 500 MG tablet Take 2 tablets (1,000 mg total) by mouth 4 (four) times daily. 03/29/23   Geiple, Joshua, PA-C  mupirocin  ointment (BACTROBAN ) 2 % Apply 1 application topically 2 (two) times daily. 01/11/22   [place] Jon HERO, PA-C  naproxen  (NAPROSYN ) 500 MG tablet Take 1 tablet (500 mg total) by mouth 2 (two) times daily. 03/29/23   Geiple, Joshua, PA-C  topiramate  (TOPAMAX ) 25 MG tablet TAKE 1 TABLET BY MOUTH EVERYDAY AT BEDTIME 05/01/21   Vicci Barnie NOVAK, MD    Allergies: Patient has no known allergies.    Review of Systems  All other systems reviewed and are negative.   Updated Vital Signs BP 113/63 (BP Location: Left Arm)   Pulse (!) 57   Temp 98 F (36.7 C) (Oral)   Resp 18   LMP 07/20/2024   SpO2 100%   Physical Exam Vitals and nursing note reviewed.  Constitutional:      Appearance: Normal appearance.  Pulmonary:     Effort: Pulmonary effort is normal.  Skin:    General: Skin is warm and dry.     Comments: To the right axilla, there is a 3 cm x 5 cm, swollen, fluctuant area consistent with abscess.  No spontaneous drainage.  Neurological:     Mental Status: She is alert.     (all labs ordered are listed,  but only abnormal results are displayed) Labs Reviewed - No data to display  EKG: None  Radiology: No results found.   INCISION AND DRAINAGE Performed by: Vicenta Able Consent: Verbal consent obtained. Risks and benefits: risks, benefits and alternatives were discussed Type: abscess  Body area: right axilla  Anesthesia: local infiltration  Incision was made with a scalpel.  Local anesthetic: lidocaine  1% without epinephrine  Anesthetic total: 3 ml  Complexity: complex Blunt dissection to break up loculations  Drainage: purulent  Drainage amount: moderate  Packing material: none placed  Patient tolerance: Patient tolerated the procedure well with no immediate complications.    Medications Ordered in the ED  lidocaine  (PF) (XYLOCAINE ) 1 % injection 5 mL (has no administration in time range)  clindamycin  (CLEOCIN ) capsule 300 mg (has no administration in time range)  oxyCODONE -acetaminophen  (PERCOCET/ROXICET) 5-325 MG per tablet 2 tablet (has no administration in time range)                                    Medical Decision Making Risk Prescription drug management.   Patient with history of hidradenitis suppurativa presenting with abscess to the right axilla.  This was incised and drained as below.  Patient to be treated with clindamycin , pain  medication, warm compresses, and follow-up as needed.     Final diagnoses:  None    ED Discharge Orders     None          Geroldine Berg, MD 08/11/24 0002

## 2024-08-10 NOTE — ED Triage Notes (Signed)
 Under right arm abscess, swelling painful x 3 days Some clear drainage Unable to put arm down due to pain

## 2024-08-11 MED ORDER — OXYCODONE-ACETAMINOPHEN 5-325 MG PO TABS: 1.0000 | ORAL_TABLET | Freq: Four times a day (QID) | ORAL | 0 refills | Status: AC | PRN

## 2024-08-11 MED ORDER — CLINDAMYCIN HCL 300 MG PO CAPS: 300.0000 mg | ORAL_CAPSULE | Freq: Four times a day (QID) | ORAL | 0 refills | Status: AC

## 2024-08-11 NOTE — Discharge Instructions (Signed)
 Begin taking clindamycin  as per  Begin taking Percocet as prescribed as needed for pain.  After  Apply warm compresses as frequently as possible for the next several days.

## 2024-08-11 NOTE — ED Notes (Signed)
 Pt given discharge instructions. Opportunities given for questions. IV removed. Pt stable at time of discharge.
